# Patient Record
Sex: Male | Born: 1950 | Hispanic: Refuse to answer | Marital: Single | Smoking: Never smoker
Health system: Southern US, Community
[De-identification: ages and names within clinical notes are randomized; demographics above are authoritative.]

## PROBLEM LIST (undated history)

## (undated) DIAGNOSIS — Z85828 Personal history of other malignant neoplasm of skin: Secondary | ICD-10-CM

## (undated) DIAGNOSIS — M0230 Reiter's disease, unspecified site: Secondary | ICD-10-CM

## (undated) DIAGNOSIS — R001 Bradycardia, unspecified: Secondary | ICD-10-CM

## (undated) DIAGNOSIS — E785 Hyperlipidemia, unspecified: Secondary | ICD-10-CM

## (undated) DIAGNOSIS — I491 Atrial premature depolarization: Secondary | ICD-10-CM

## (undated) DIAGNOSIS — M450 Ankylosing spondylitis of multiple sites in spine: Secondary | ICD-10-CM

## (undated) HISTORY — PX: JOINT REPLACEMENT: SHX530

## (undated) HISTORY — PX: OTHER SURGICAL HISTORY: SHX169

---

## 2005-06-04 ENCOUNTER — Ambulatory Visit: Payer: Self-pay | Admitting: Gastroenterology

## 2005-09-13 ENCOUNTER — Ambulatory Visit: Payer: Self-pay | Admitting: Otolaryngology

## 2005-10-25 ENCOUNTER — Ambulatory Visit: Payer: Self-pay | Admitting: Otolaryngology

## 2006-12-13 ENCOUNTER — Emergency Department: Payer: Self-pay | Admitting: Emergency Medicine

## 2009-11-15 ENCOUNTER — Ambulatory Visit: Payer: Self-pay | Admitting: General Practice

## 2009-11-25 ENCOUNTER — Ambulatory Visit: Payer: Self-pay | Admitting: Cardiology

## 2009-11-25 ENCOUNTER — Inpatient Hospital Stay: Payer: Self-pay | Admitting: General Practice

## 2009-11-25 IMAGING — CR DG HIP COMPLETE 2+V*L*
1 series · 4 of 4 positions shown · non-contrast
Comparison: none

REASON FOR EXAM: s/p THA
COMMENTS:   Bedside (portable):Y

PROCEDURE:     DXR - DXR HIP LEFT COMPLETE  - [DATE]  [DATE]
RESULT:     Portable AP views of the left hip show the patient to be status
post left hip revision. No fracture about the prosthetic components is seen.
There is no dislocation at the prosthetic hip joint.

[Series 1: view not recorded · 0.17mm/px · 4 of 4 slices shown]
[im 1/4]
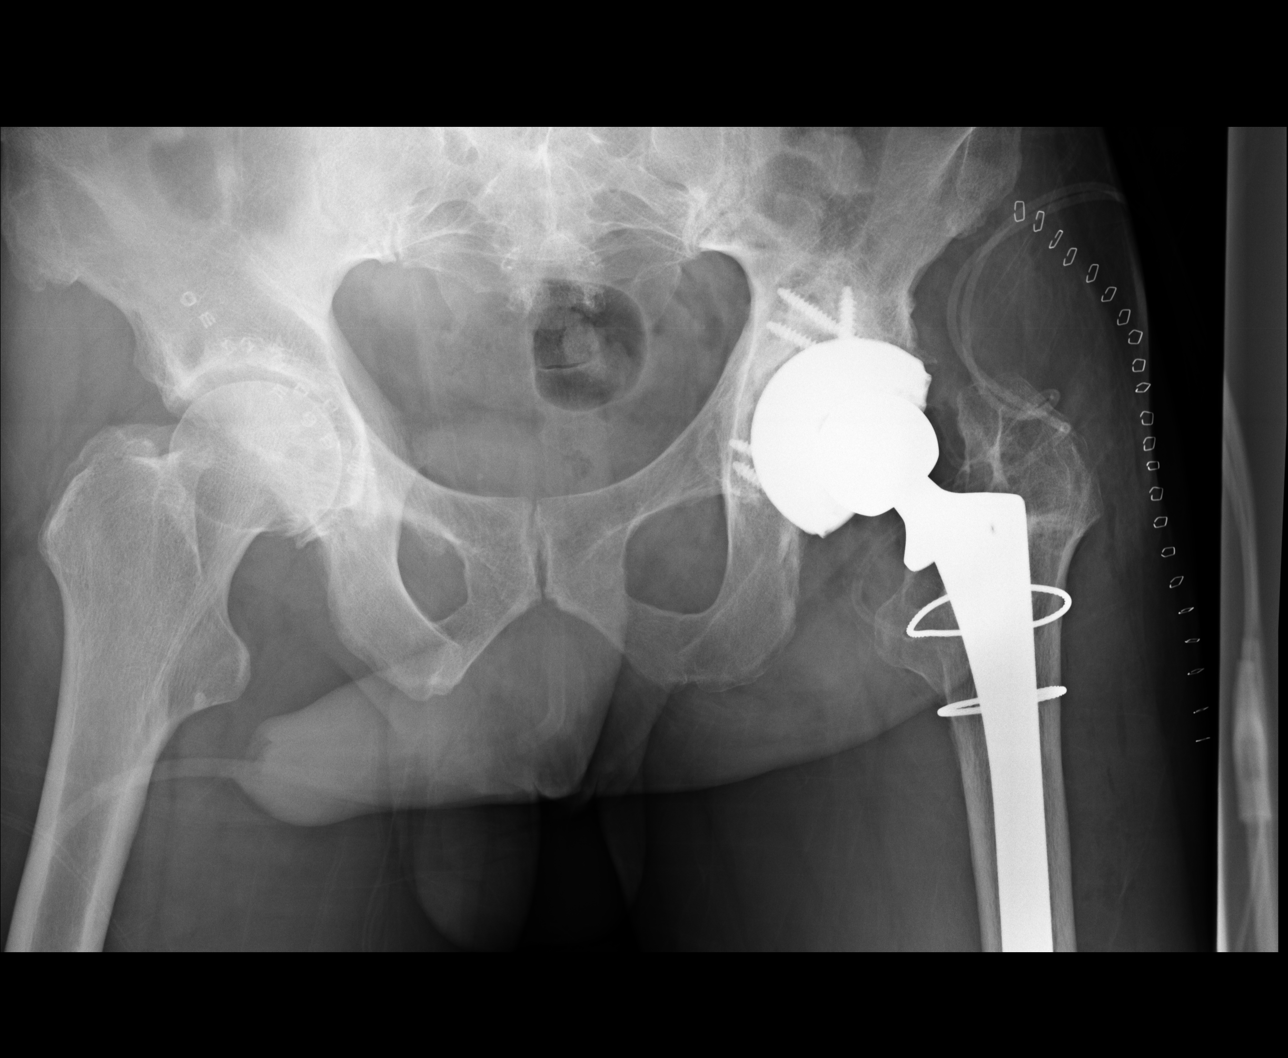
[im 2/4]
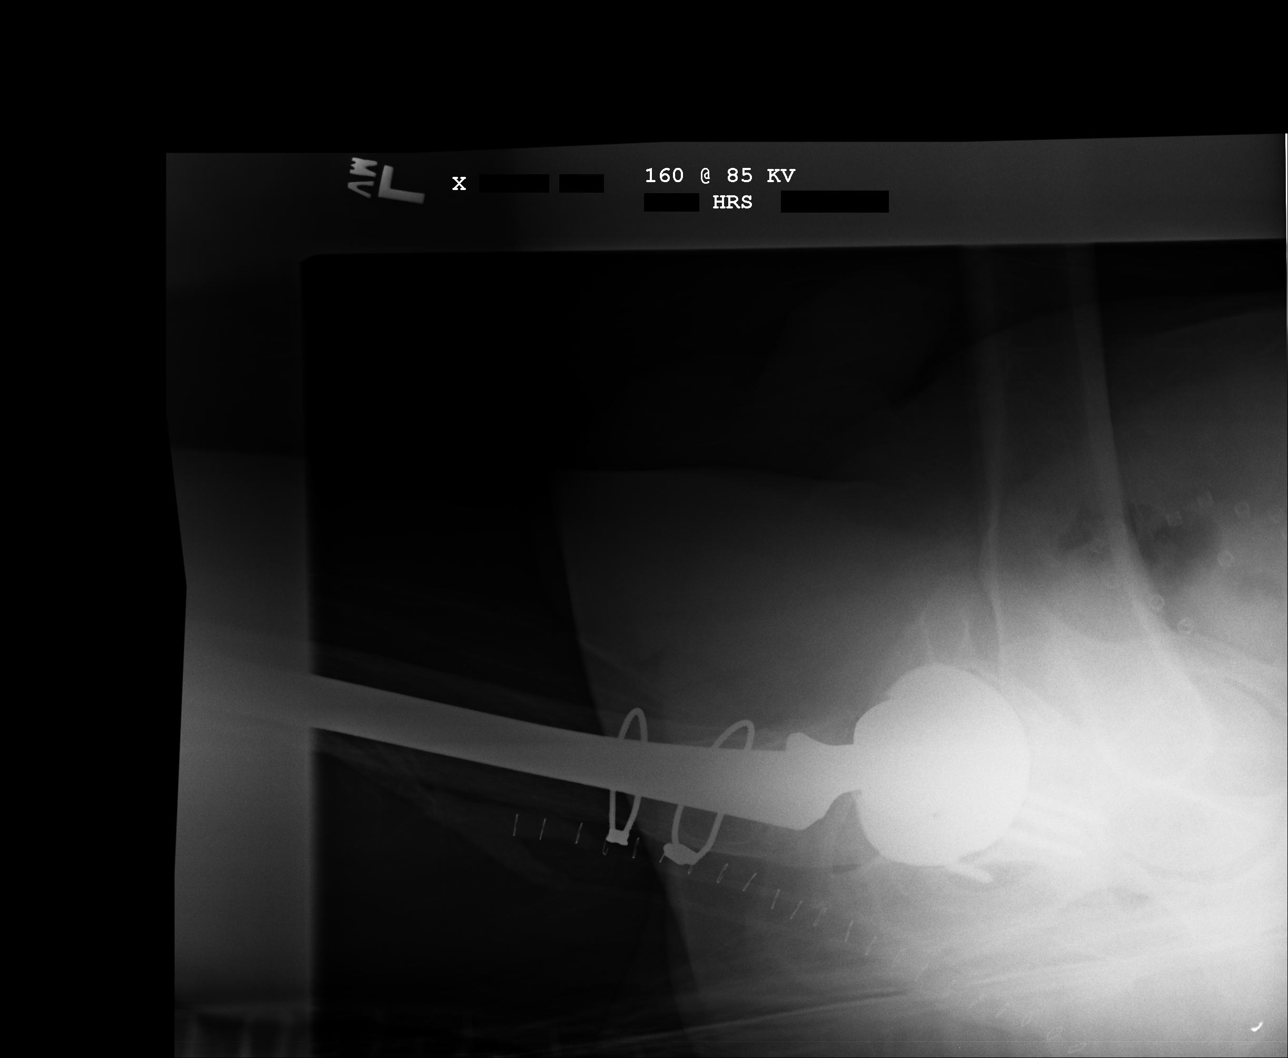
[im 3/4]
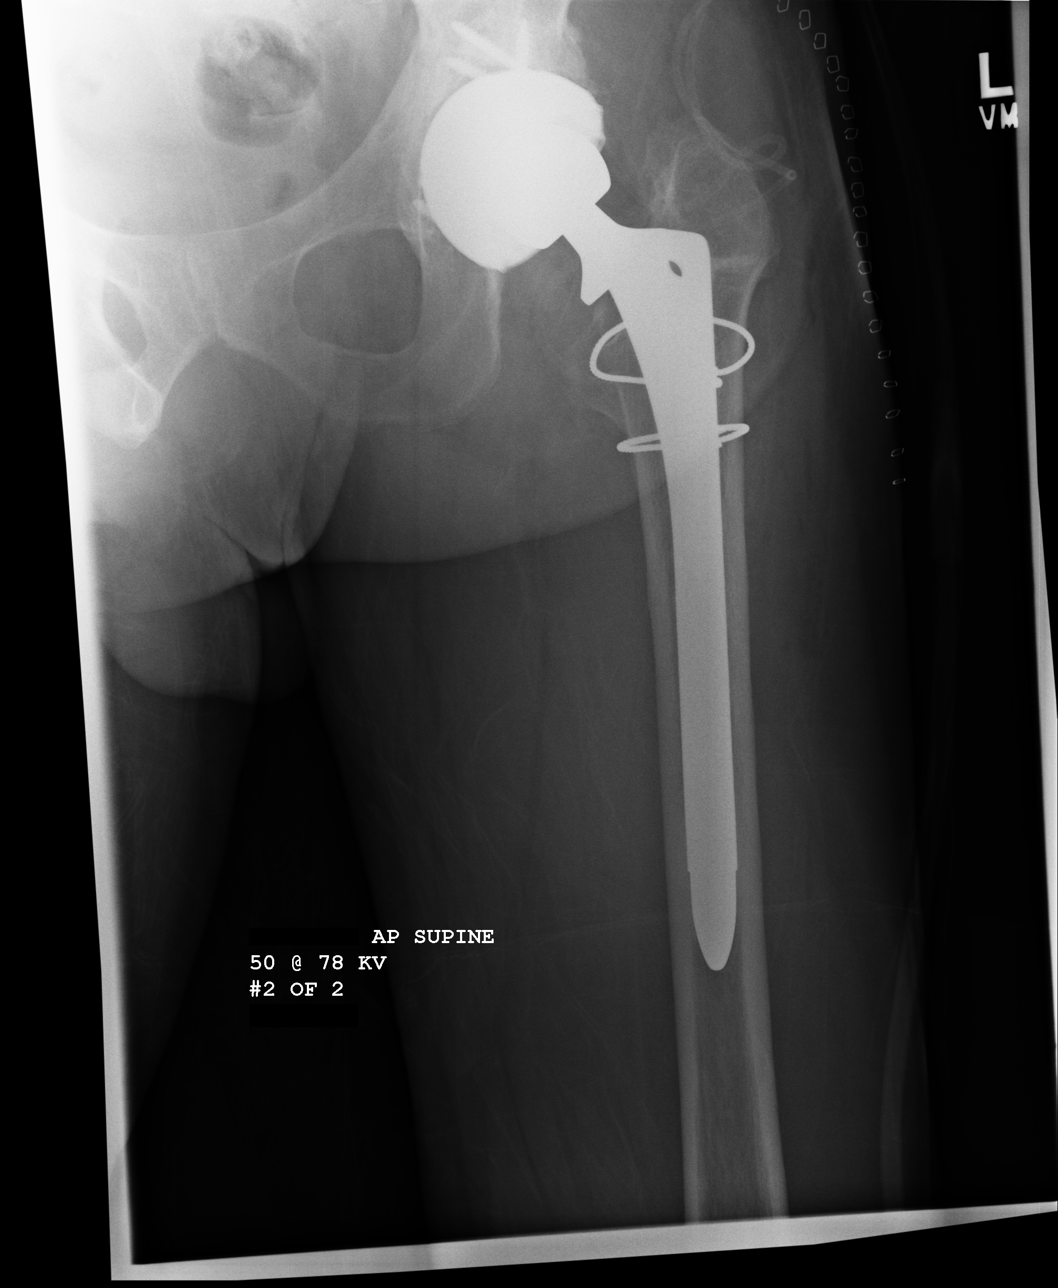
[im 4/4]
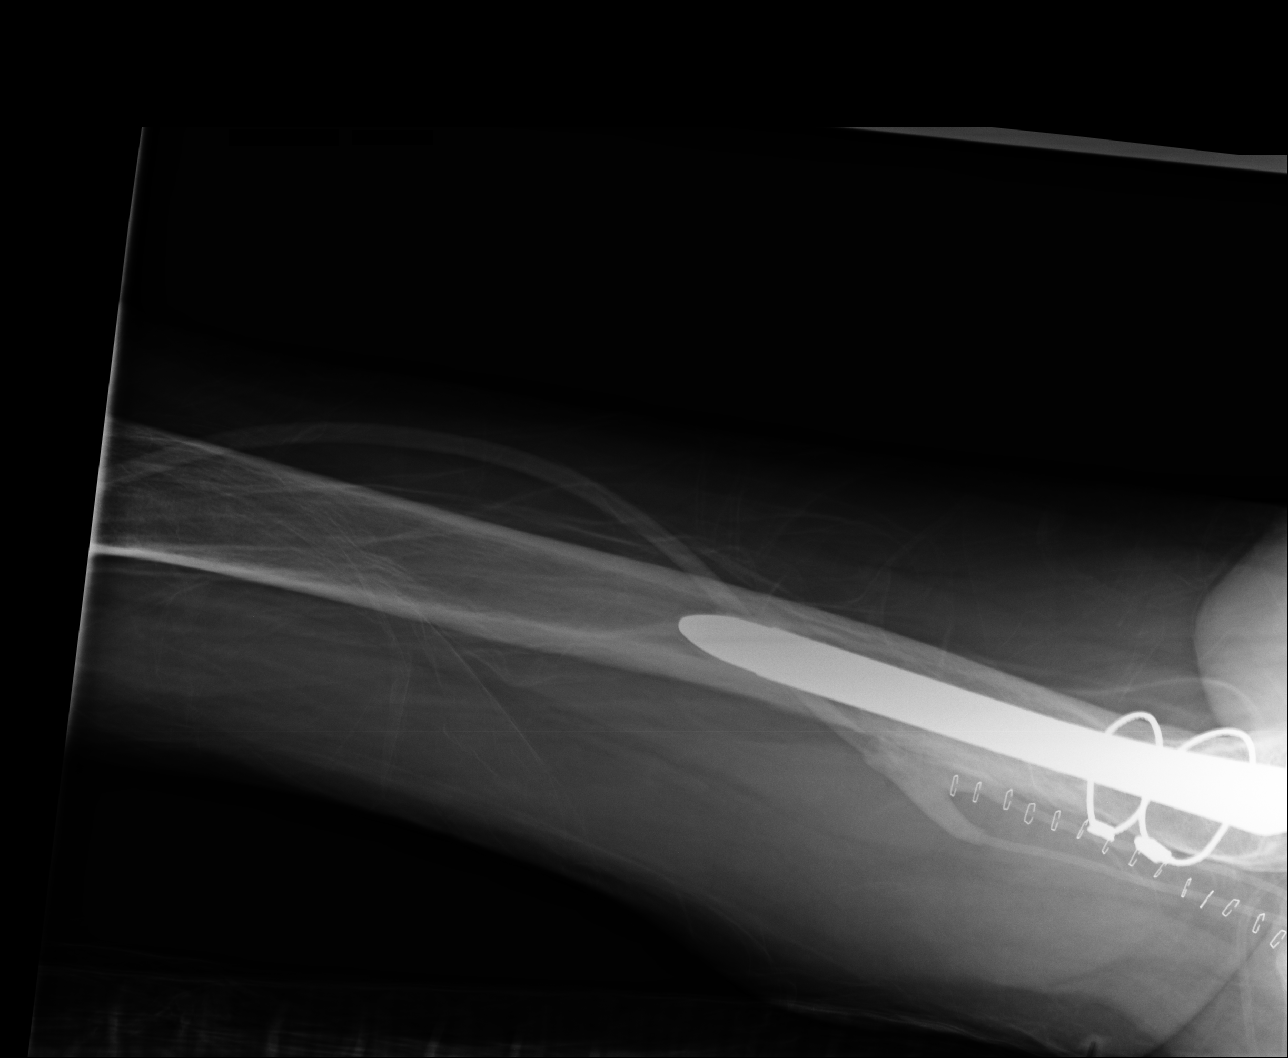

[4 of 4 positions shown; findings below may reference images not displayed]

IMPRESSION: No abnormal post operative changes are identified.

## 2010-10-09 ENCOUNTER — Ambulatory Visit: Payer: Self-pay | Admitting: Gastroenterology

## 2015-12-16 ENCOUNTER — Encounter: Payer: Self-pay | Admitting: *Deleted

## 2015-12-19 ENCOUNTER — Encounter
Admission: RE | Disposition: A | Discharge status: Home / Self Care | Payer: Self-pay | Source: Ambulatory Visit | Attending: Unknown Physician Specialty

## 2015-12-19 ENCOUNTER — Ambulatory Visit
Admission: RE | Admit: 2015-12-19 | Discharge: 2015-12-19 | Disposition: A | Discharge status: Home / Self Care | Payer: Medicare Other | Source: Ambulatory Visit | Attending: Unknown Physician Specialty | Admitting: Unknown Physician Specialty

## 2015-12-19 ENCOUNTER — Ambulatory Visit: Payer: Medicare Other | Admitting: Anesthesiology

## 2015-12-19 ENCOUNTER — Encounter: Payer: Self-pay | Admitting: *Deleted

## 2015-12-19 DIAGNOSIS — K621 Rectal polyp: Secondary | ICD-10-CM | POA: Diagnosis not present

## 2015-12-19 DIAGNOSIS — M459 Ankylosing spondylitis of unspecified sites in spine: Secondary | ICD-10-CM | POA: Insufficient documentation

## 2015-12-19 DIAGNOSIS — Z7982 Long term (current) use of aspirin: Secondary | ICD-10-CM | POA: Diagnosis not present

## 2015-12-19 DIAGNOSIS — Z1211 Encounter for screening for malignant neoplasm of colon: Secondary | ICD-10-CM | POA: Diagnosis present

## 2015-12-19 DIAGNOSIS — D120 Benign neoplasm of cecum: Secondary | ICD-10-CM | POA: Insufficient documentation

## 2015-12-19 DIAGNOSIS — Z79899 Other long term (current) drug therapy: Secondary | ICD-10-CM | POA: Insufficient documentation

## 2015-12-19 DIAGNOSIS — K640 First degree hemorrhoids: Secondary | ICD-10-CM | POA: Diagnosis not present

## 2015-12-19 DIAGNOSIS — K529 Noninfective gastroenteritis and colitis, unspecified: Secondary | ICD-10-CM | POA: Diagnosis not present

## 2015-12-19 DIAGNOSIS — Z8371 Family history of colonic polyps: Secondary | ICD-10-CM | POA: Diagnosis not present

## 2015-12-19 HISTORY — DX: Ankylosing spondylitis of multiple sites in spine: M45.0

## 2015-12-19 HISTORY — PX: COLONOSCOPY WITH PROPOFOL: SHX5780

## 2015-12-19 HISTORY — DX: Reiter's disease, unspecified site: M02.30

## 2015-12-19 SURGERY — COLONOSCOPY WITH PROPOFOL
Anesthesia: General

## 2015-12-19 MED ORDER — SODIUM CHLORIDE 0.9 % IV SOLN: INTRAVENOUS | Status: DC

## 2015-12-19 MED ORDER — PROPOFOL 500 MG/50ML IV EMUL
INTRAVENOUS | Status: DC | PRN
  Administered 2015-12-19: 150 ug/kg/min via INTRAVENOUS

## 2015-12-19 MED ORDER — PHENYLEPHRINE HCL 10 MG/ML IJ SOLN
INTRAMUSCULAR | Status: DC | PRN
  Administered 2015-12-19: 100 ug via INTRAVENOUS

## 2015-12-19 MED ORDER — PIPERACILLIN-TAZOBACTAM 3.375 G IVPB 30 MIN
3.3750 g | Freq: Once | INTRAVENOUS | Status: AC
  Administered 2015-12-19: 3.375 g via INTRAVENOUS
  Filled 2015-12-19: qty 50

## 2015-12-19 MED ORDER — PROPOFOL 10 MG/ML IV BOLUS
INTRAVENOUS | Status: DC | PRN
  Administered 2015-12-19: 30 mg via INTRAVENOUS
  Administered 2015-12-19: 50 mg via INTRAVENOUS

## 2015-12-19 MED ORDER — SODIUM CHLORIDE 0.9 % IV SOLN
INTRAVENOUS | Status: DC
  Administered 2015-12-19: 1000 mL via INTRAVENOUS

## 2015-12-19 NOTE — H&P (Signed)
   Primary Care Physician:  Idelle Crouch, MD Primary Gastroenterologist:  Dr. Vira Agar  Pre-Procedure History & Physical: HPI:  Derek Mcdowell is a 65 y.o. male is here for an colonoscopy.   Past Medical History:  Diagnosis Date  . Ankylosing spondylitis of multiple sites in spine ([place])   . 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 JOINT REPLACEMENT    . left ear surgery      Prior to Admission medications   Medication Sig Start Date End Date Taking? Authorizing Provider  aspirin EC 81 MG tablet Take 81 mg by mouth daily.   Yes Historical Provider, MD  etanercept (ENBREL) 50 MG/ML injection Inject 50 mg into the skin once a week.   Yes Historical Provider, MD  indomethacin (INDOCIN) 25 MG capsule Take 25 mg by mouth 2 (two) times daily with a meal.   Yes Historical Provider, MD  lansoprazole (PREVACID) 30 MG capsule Take 30 mg by mouth daily at 12 noon.   Yes Historical Provider, MD    Allergies as of 10/20/2015  . (Not on File)    History reviewed. No pertinent family history.  Social History   Social History  . Marital status: Single    Spouse name: N/A  . Number of children: N/A  . Years of education: N/A   Occupational History  . Not on file.   Social History Main Topics  . Smoking status: Never Smoker  . Smokeless tobacco: Never Used  . Alcohol use No  . Drug use: No  . Sexual activity: Not on file   Other Topics Concern  . Not on file   Social History Narrative  . No narrative on file    Review of Systems: See HPI, otherwise negative ROS  Physical Exam: BP (!) 148/91   Pulse (!) 54   Temp 97.6 F (36.4 C) (Tympanic)   Resp 20   Ht 6\' 2"  (1.88 m)   Wt 93 kg (205 lb)   SpO2 100%   BMI 26.32 kg/m  General:   Alert,  pleasant and cooperative in NAD Head:  Normocephalic and atraumatic. Neck:  Supple; no masses or thyromegaly. Lungs:  Clear throughout to auscultation.    Heart:  Regular rate and  rhythm. Abdomen:  Soft, nontender and nondistended. Normal bowel sounds, without guarding, and without rebound.   Neurologic:  Alert and  oriented x4;  grossly normal neurologically.  Impression/Plan: Derek Mcdowell is here for an colonoscopy to be performed for FH colon polyps in mother and brother.  Risks, benefits, limitations, and alternatives regarding  colonoscopy have been reviewed with the patient.  Questions have been answered.  All parties agreeable.   Gaylyn Cheers, MD  12/19/2015, 2:52 PM

## 2015-12-19 NOTE — Transfer of Care (Signed)
Immediate Anesthesia Transfer of Care Note  Patient: Derek Mcdowell  Procedure(s) Performed: Procedure(s) with comments: COLONOSCOPY WITH PROPOFOL (N/A) - Zosyn IVPB  Patient Location: Endoscopy Unit  Anesthesia Type:General  Level of Consciousness: sedated  Airway & Oxygen Therapy: Patient connected to nasal cannula oxygen  Post-op Assessment: Post -op Vital signs reviewed and stable  Post vital signs: stable  Last Vitals:  Vitals:   12/19/15 1433 12/19/15 1523  BP: (!) 148/91   Pulse: (!) 54   Resp: 20   Temp: 36.4 C 36.4 C    Last Pain:  Vitals:   12/19/15 1523  TempSrc: Tympanic         Complications: No apparent anesthesia complications

## 2015-12-19 NOTE — Anesthesia Postprocedure Evaluation (Signed)
Anesthesia Post Note  Patient: Derek Mcdowell  Procedure(s) Performed: Procedure(s) (LRB): COLONOSCOPY WITH PROPOFOL (N/A)  Patient location during evaluation: Endoscopy Anesthesia Type: General Level of consciousness: awake and alert Pain management: pain level controlled Vital Signs Assessment: post-procedure vital signs reviewed and stable Respiratory status: spontaneous breathing, nonlabored ventilation, respiratory function stable and patient connected to nasal cannula oxygen Cardiovascular status: blood pressure returned to baseline and stable Postop Assessment: no signs of nausea or vomiting Anesthetic complications: no    Last Vitals:  Vitals:   12/19/15 1543 12/19/15 1553  BP: 121/90 140/85  Pulse: 75 68  Resp: 18 (!) 22  Temp:      Last Pain:  Vitals:   12/19/15 1523  TempSrc: Tympanic  PainSc: Asleep                 Martha Clan

## 2015-12-19 NOTE — Op Note (Signed)
[facility] Gastroenterology Patient Name: Derek Mcdowell Procedure Date: 12/19/2015 2:56 PM MRN: [id number] Account #: [id number] Date of Birth: 05-19-1950 Admit Type: Outpatient Age: 65 Room: [facility] ENDO ROOM 1 Gender: Male Note Status: Finalized Procedure:            Colonoscopy Indications:          Colon cancer screening in patient at increased risk:                        Family history of 1st-degree relative with colon polyps Providers:            Manya Silvas, MD Referring MD:         Leonie Douglas. Doy Hutching, MD (Referring MD) Medicines:            Propofol per Anesthesia Complications:        No immediate complications. Procedure:            Pre-Anesthesia Assessment:                       - After reviewing the risks and benefits, the patient                        was deemed in satisfactory condition to undergo the                        procedure.                       After obtaining informed consent, the colonoscope was                        passed under direct vision. Throughout the procedure,                        the patient's blood pressure, pulse, and oxygen                        saturations were monitored continuously. The                        Colonoscope was introduced through the anus and                        advanced to the the cecum, identified by appendiceal                        orifice and ileocecal valve. The colonoscopy was                        performed without difficulty. The patient tolerated the                        procedure well. The quality of the bowel preparation                        was good. Findings:      Two sessile polyps were found in the rectum and cecum. The polyps were       diminutive in size. These polyps were removed with a jumbo cold forceps.       Resection and retrieval were complete.  Internal hemorrhoids were found during endoscopy. The hemorrhoids were       small and Grade I (internal  hemorrhoids that do not prolapse).      The exam was otherwise without abnormality. Impression:           - Two diminutive polyps in the rectum and in the cecum,                        removed with a jumbo cold forceps. Resected and                        retrieved.                       - Internal hemorrhoids.                       - The examination was otherwise normal. Recommendation:       - Await pathology results. Manya Silvas, MD 12/19/2015 3:20:30 PM This report has been signed electronically. Number of Addenda: 0 Note Initiated On: 12/19/2015 2:56 PM Scope Withdrawal Time: 0 hours 13 minutes 0 seconds  Total Procedure Duration: 0 hours 17 minutes 12 seconds       [facility]

## 2015-12-21 ENCOUNTER — Encounter: Payer: Self-pay | Admitting: Unknown Physician Specialty

## 2015-12-21 LAB — SURGICAL PATHOLOGY

## 2015-12-21 NOTE — Anesthesia Preprocedure Evaluation (Signed)
Anesthesia Evaluation  Patient identified by MRN, date of birth, ID band Patient awake    Reviewed: Allergy & Precautions, H&P , NPO status , Patient's Chart, lab work & pertinent test results, reviewed documented beta blocker date and time   Airway Mallampati: II   Neck ROM: full    Dental  (+) Poor Dentition   Pulmonary neg pulmonary ROS,    Pulmonary exam normal        Cardiovascular negative cardio ROS Normal cardiovascular exam Rhythm:regular Rate:Normal     Neuro/Psych negative neurological ROS  negative psych ROS   GI/Hepatic negative GI ROS, Neg liver ROS,   Endo/Other  negative endocrine ROS  Renal/GU negative Renal ROS  negative genitourinary   Musculoskeletal   Abdominal   Peds  Hematology negative hematology ROS (+)   Anesthesia Other Findings Past Medical History: No date: Ankylosing spondylitis of multiple sites in sp* No date: Reiter's disease ([place]) Past Surgical History: No date: JOINT REPLACEMENT No date: left ear surgery BMI    Body Mass Index:  26.32 kg/m     Reproductive/Obstetrics negative OB ROS                             Anesthesia Physical Anesthesia Plan  ASA: III  Anesthesia Plan: General   Post-op Pain Management:    Induction:   Airway Management Planned:   Additional Equipment:   Intra-op Plan:   Post-operative Plan:   Informed Consent: I have reviewed the patients History and Physical, chart, labs and discussed the procedure including the risks, benefits and alternatives for the proposed anesthesia with the patient or authorized representative who has indicated his/her understanding and acceptance.   Dental Advisory Given  Plan Discussed with: CRNA  Anesthesia Plan Comments:         Anesthesia Quick Evaluation

## 2018-04-09 ENCOUNTER — Observation Stay
Admission: EM | Admit: 2018-04-09 | Discharge: 2018-04-10 | Disposition: A | Discharge status: Home / Self Care | Payer: Medicare Other | Attending: Internal Medicine | Admitting: Internal Medicine

## 2018-04-09 ENCOUNTER — Encounter: Payer: Self-pay | Admitting: *Deleted

## 2018-04-09 ENCOUNTER — Other Ambulatory Visit: Payer: Self-pay

## 2018-04-09 ENCOUNTER — Emergency Department: Payer: Medicare Other

## 2018-04-09 DIAGNOSIS — Z7982 Long term (current) use of aspirin: Secondary | ICD-10-CM | POA: Diagnosis not present

## 2018-04-09 DIAGNOSIS — Z79899 Other long term (current) drug therapy: Secondary | ICD-10-CM | POA: Insufficient documentation

## 2018-04-09 DIAGNOSIS — K219 Gastro-esophageal reflux disease without esophagitis: Secondary | ICD-10-CM | POA: Diagnosis not present

## 2018-04-09 DIAGNOSIS — R002 Palpitations: Secondary | ICD-10-CM | POA: Diagnosis not present

## 2018-04-09 DIAGNOSIS — R0789 Other chest pain: Secondary | ICD-10-CM | POA: Diagnosis not present

## 2018-04-09 DIAGNOSIS — M459 Ankylosing spondylitis of unspecified sites in spine: Secondary | ICD-10-CM | POA: Diagnosis not present

## 2018-04-09 DIAGNOSIS — R079 Chest pain, unspecified: Secondary | ICD-10-CM | POA: Diagnosis present

## 2018-04-09 DIAGNOSIS — E860 Dehydration: Secondary | ICD-10-CM

## 2018-04-09 LAB — BASIC METABOLIC PANEL
Anion gap: 12 (ref 5–15)
BUN: 30 mg/dL — AB (ref 8–23)
CO2: 25 mmol/L (ref 22–32)
Calcium: 9.4 mg/dL (ref 8.9–10.3)
Chloride: 105 mmol/L (ref 98–111)
Creatinine, Ser: 1.48 mg/dL — ABNORMAL HIGH (ref 0.61–1.24)
GFR calc Af Amer: 56 mL/min — ABNORMAL LOW (ref >60)
GFR, EST NON AFRICAN AMERICAN: 48 mL/min — AB (ref >60)
GLUCOSE: 123 mg/dL — AB (ref 70–99)
POTASSIUM: 3.6 mmol/L (ref 3.5–5.1)
Sodium: 142 mmol/L (ref 135–145)

## 2018-04-09 LAB — TROPONIN I
Troponin I: <0.03 ng/mL (ref <0.03)
Troponin I: <0.03 ng/mL (ref <0.03)
Troponin I: <0.03 ng/mL (ref <0.03)

## 2018-04-09 LAB — CBC
HCT: 47.4 % (ref 39.0–52.0)
Hemoglobin: 16.3 g/dL (ref 13.0–17.0)
MCH: 30.1 pg (ref 26.0–34.0)
MCHC: 34.4 g/dL (ref 30.0–36.0)
MCV: 87.6 fL (ref 80.0–100.0)
PLATELETS: 247 10*3/uL (ref 150–400)
RBC: 5.41 MIL/uL (ref 4.22–5.81)
RDW: 12.6 % (ref 11.5–15.5)
WBC: 7.5 10*3/uL (ref 4.0–10.5)
nRBC: 0 % (ref 0.0–0.2)

## 2018-04-09 MED ORDER — ASPIRIN EC 81 MG PO TBEC: 81.0000 mg | DELAYED_RELEASE_TABLET | Freq: Every day | ORAL | Status: DC

## 2018-04-09 MED ORDER — INDOMETHACIN 25 MG PO CAPS
25.0000 mg | ORAL_CAPSULE | Freq: Two times a day (BID) | ORAL | Status: DC
  Filled 2018-04-09 (×2): qty 1

## 2018-04-09 MED ORDER — ASPIRIN 81 MG PO CHEW
324.0000 mg | CHEWABLE_TABLET | Freq: Once | ORAL | Status: AC
  Administered 2018-04-09: 243 mg via ORAL
  Filled 2018-04-09: qty 4

## 2018-04-09 MED ORDER — ACETAMINOPHEN 325 MG PO TABS: 650.0000 mg | ORAL_TABLET | Freq: Four times a day (QID) | ORAL | Status: DC | PRN

## 2018-04-09 MED ORDER — ONDANSETRON HCL 4 MG/2ML IJ SOLN: 4.0000 mg | Freq: Four times a day (QID) | INTRAMUSCULAR | Status: DC | PRN

## 2018-04-09 MED ORDER — ONDANSETRON HCL 4 MG PO TABS: 4.0000 mg | ORAL_TABLET | Freq: Four times a day (QID) | ORAL | Status: DC | PRN

## 2018-04-09 MED ORDER — SODIUM CHLORIDE 0.9% FLUSH
3.0000 mL | Freq: Once | INTRAVENOUS | Status: AC
  Administered 2018-04-09: 3 mL via INTRAVENOUS

## 2018-04-09 MED ORDER — ACETAMINOPHEN 650 MG RE SUPP: 650.0000 mg | Freq: Four times a day (QID) | RECTAL | Status: DC | PRN

## 2018-04-09 MED ORDER — SODIUM CHLORIDE 0.9 % IV BOLUS
1000.0000 mL | Freq: Once | INTRAVENOUS | Status: AC
  Administered 2018-04-09: 1000 mL via INTRAVENOUS

## 2018-04-09 MED ORDER — ENOXAPARIN SODIUM 40 MG/0.4ML ~~LOC~~ SOLN
40.0000 mg | SUBCUTANEOUS | Status: DC
  Administered 2018-04-09: 40 mg via SUBCUTANEOUS
  Filled 2018-04-09: qty 0.4

## 2018-04-09 MED ORDER — PANTOPRAZOLE SODIUM 40 MG PO TBEC: 40.0000 mg | DELAYED_RELEASE_TABLET | Freq: Every day | ORAL | Status: DC

## 2018-04-09 MED ORDER — NITROGLYCERIN 0.4 MG SL SUBL: 0.4000 mg | SUBLINGUAL_TABLET | SUBLINGUAL | Status: DC | PRN

## 2018-04-09 NOTE — ED Notes (Signed)
ED TO INPATIENT HANDOFF REPORT  ED Nurse Name and Phone #: Anda Kraft [phone]  S Name/Age/Gender Derek Mcdowell 68 y.o. male Room/Bed: ED04A/ED04A  Code Status   Code Status: Not on file  Home/SNF/Other Home Patient oriented to: self, place, time and situation Is this baseline? Yes   Triage Complete: Triage complete  Chief Complaint chest pain  Triage Note Pt to triage via wheelchair.  Pt had hoilter monitor placed today.  Now, pt has chest pain, nausea, lightheadness and sob.  Pt alert.      Allergies No Known Allergies  Level of Care/Admitting Diagnosis ED Disposition    ED Disposition Condition [place] [facility]: [facility] [[id number]]  Level of Care: Telemetry [5]  Diagnosis: Chest pain [892119]  Admitting Physician: Henreitta Leber [[id number]]  Attending Physician: Henreitta Leber [[id number]]  PT Class (Do Not Modify): Observation [104]  PT Acc Code (Do Not Modify): Observation [10022]       B Medical/Surgery History Past Medical History:  Diagnosis Date  . Ankylosing spondylitis of multiple sites in spine ([place])   . 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 COLONOSCOPY WITH PROPOFOL N/A 12/19/2015   Procedure: COLONOSCOPY WITH PROPOFOL;  Surgeon: Manya Silvas, MD;  Location: [facility] ENDOSCOPY;  Service: Endoscopy;  Laterality: N/A;  Zosyn IVPB  . JOINT REPLACEMENT    . left ear surgery       A IV Location/Drains/Wounds Patient Lines/Drains/Airways Status   Active Line/Drains/Airways    Name:   Placement date:   Placement time:   Site:   Days:   Peripheral IV 04/09/18 Right Antecubital   04/09/18    1608    Antecubital   less than 1          Intake/Output Last 24 hours No intake or output data in the 24 hours ending 04/09/18 1745  Labs/Imaging Results for orders placed or performed during the hospital encounter of 04/09/18 (from the past 48 hour(s))  Basic metabolic panel      Status: Abnormal   Collection Time: 04/09/18  4:07 PM  Result Value Ref Range   Sodium 142 135 - 145 mmol/L   Potassium 3.6 3.5 - 5.1 mmol/L   Chloride 105 98 - 111 mmol/L   CO2 25 22 - 32 mmol/L   Glucose, Bld 123 (H) 70 - 99 mg/dL   BUN 30 (H) 8 - 23 mg/dL   Creatinine, Ser 1.48 (H) 0.61 - 1.24 mg/dL   Calcium 9.4 8.9 - 10.3 mg/dL   GFR calc non Af Amer 48 (L) >60 mL/min   GFR calc Af Amer 56 (L) >60 mL/min   Anion gap 12 5 - 15    Comment: Performed at [facility], [address] [place], [place] [address]  CBC     Status: None   Collection Time: 04/09/18  4:07 PM  Result Value Ref Range   WBC 7.5 4.0 - 10.5 K/uL   RBC 5.41 4.22 - 5.81 MIL/uL   Hemoglobin 16.3 13.0 - 17.0 g/dL   HCT 47.4 39.0 - 52.0 %   MCV 87.6 80.0 - 100.0 fL   MCH 30.1 26.0 - 34.0 pg   MCHC 34.4 30.0 - 36.0 g/dL   RDW 12.6 11.5 - 15.5 %   Platelets 247 150 - 400 K/uL   nRBC 0.0 0.0 - 0.2 %    Comment: Performed at [facility], [address] [place], [place] [address]  Troponin I - ONCE - STAT     Status: None   Collection Time: 04/09/18  4:07 PM  Result Value Ref Range   Troponin I <0.03 <0.03 ng/mL    Comment: Performed at [facility], [address] [place], [place] [address]   Dg Chest 2 View  Result Date: 04/09/2018 CLINICAL DATA:  68 year old male with chest pain, nausea, shortness of breath and lightheadedness following placement of a Holter monitor. EXAM: CHEST - 2 VIEW COMPARISON:  None. FINDINGS: Electronic monitor projects over the right chest. The lungs are clear and negative for focal airspace consolidation, pulmonary edema or suspicious pulmonary nodule. No pleural effusion or pneumothorax. Cardiac and mediastinal contours are within normal limits. No acute fracture or lytic or blastic osseous lesions. The visualized upper abdominal bowel gas pattern is unremarkable. IMPRESSION: No active cardiopulmonary disease. Electronically Signed   By: Jacqulynn Cadet  M.D.   On: 04/09/2018 16:32    Pending Labs [employer] (From admission, onward)    Start     Ordered   Signed and Held  CBC  (enoxaparin (LOVENOX)    CrCl >/= 30 ml/min)  Once,   R    Comments:  Baseline for enoxaparin therapy IF NOT ALREADY DRAWN.  Notify MD if PLT < 100 K.    Signed and Held   Signed and Held  Creatinine, serum  (enoxaparin (LOVENOX)    CrCl >/= 30 ml/min)  Once,   R    Comments:  Baseline for enoxaparin therapy IF NOT ALREADY DRAWN.    Signed and Held   Signed and Held  Creatinine, serum  (enoxaparin (LOVENOX)    CrCl >/= 30 ml/min)  Weekly,   R    Comments:  while on enoxaparin therapy    Signed and Held   Signed and Held  Troponin I - Now Then Q4H  Now then every 4 hours,   R     Signed and Held          Vitals/Pain Today's Vitals   04/09/18 1559 04/09/18 1700 04/09/18 1715 04/09/18 1730  BP: (!) 95/37 (!) 92/53 112/69 109/75  Pulse: (!) 42 73 (!) 175 (!) 53  Resp: 20 16 (!) 21 19  Temp: 97.7 F (36.5 C)     TempSrc: Oral     SpO2: 99% 100% 94% 99%  Weight:      Height:      PainSc:        Isolation Precautions No active isolations  Medications Medications  nitroGLYCERIN (NITROSTAT) SL tablet 0.4 mg (has no administration in time range)  sodium chloride flush (NS) 0.9 % injection 3 mL (3 mLs Intravenous Given 04/09/18 1623)  aspirin chewable tablet 324 mg (243 mg Oral Given 04/09/18 1625)  sodium chloride 0.9 % bolus 1,000 mL (1,000 mLs Intravenous New Bag/Given 04/09/18 1733)    Mobility walks Low fall risk   Focused Assessments Cardiac Assessment Handoff:  Cardiac Rhythm: Normal sinus rhythm Lab Results  Component Value Date   TROPONINI <0.03 04/09/2018   No results found for: DDIMER Does the Patient currently have chest pain? No     R Recommendations: See Admitting Provider Note  Report given to:   Additional Notes:

## 2018-04-09 NOTE — ED Triage Notes (Signed)
Pt to triage via wheelchair.  Pt had hoilter monitor placed today.  Now, pt has chest pain, nausea, lightheadness and sob.  Pt alert.

## 2018-04-09 NOTE — Plan of Care (Signed)
  Problem: Education: Goal: Knowledge of General Education information will improve Description Including pain rating scale, medication(s)/side effects and non-pharmacologic comfort measures Outcome: Progressing   Problem: Cardiac: Goal: Ability to achieve and maintain adequate cardiovascular perfusion will improve Outcome: Progressing   

## 2018-04-09 NOTE — H&P (Signed)
[place] [facility] [place] NAME: Derek Mcdowell    MR#:  [id number]  DATE OF BIRTH:  12-13-1950  DATE OF ADMISSION:  04/09/2018  PRIMARY CARE PHYSICIAN: Idelle Crouch, MD   REQUESTING/REFERRING PHYSICIAN: Dr. Rudene Re  CHIEF COMPLAINT:   Chief Complaint  Patient presents with  . Chest Pain    HISTORY OF PRESENT ILLNESS:  Derek Mcdowell  is a 68 y.o. male with a known history of ankylosing spondylitis, who presents to the hospital due to chest pain.  Patient says he was in the usual state of health when this afternoon he was watching TV and suddenly became quite diaphoretic and then shortly thereafter developed chest pain/pressure in the center of his chest nonradiating associated with some nausea but no vomiting.  The pain lasted about 30 minutes and resolved on its own.  Patient then just did not feel like himself and thought something was wrong and therefore came to the ER for further evaluation.  Patient has no previous history of cardiac history.  Given his typical symptoms for angina hospital services were contacted for admission.  PAST MEDICAL HISTORY:   Past Medical History:  Diagnosis Date  . Ankylosing spondylitis of multiple sites in spine ([place])   . Reiter's disease ([place])     PAST SURGICAL HISTORY:   Past Surgical History:  Procedure Laterality Date  . COLONOSCOPY WITH PROPOFOL N/A 12/19/2015   Procedure: COLONOSCOPY WITH PROPOFOL;  Surgeon: Manya Silvas, MD;  Location: [facility] ENDOSCOPY;  Service: Endoscopy;  Laterality: N/A;  Zosyn IVPB  . JOINT REPLACEMENT    . left ear surgery      SOCIAL HISTORY:   Social History   Tobacco Use  . Smoking status: Never Smoker  . Smokeless tobacco: Never Used  Substance Use Topics  . Alcohol use: No    FAMILY HISTORY:   Family History  Problem Relation Age of Onset  . Bladder Cancer Mother   . Alzheimer's disease Father   . Pneumonia Father     DRUG ALLERGIES:  No  Known Allergies  REVIEW OF SYSTEMS:   Review of Systems  Constitutional: Negative for fever and weight loss.  HENT: Negative for congestion, nosebleeds and tinnitus.   Eyes: Negative for blurred vision, double vision and redness.  Respiratory: Negative for cough, hemoptysis and shortness of breath.   Cardiovascular: Positive for chest pain. Negative for orthopnea, leg swelling and PND.  Gastrointestinal: Negative for abdominal pain, diarrhea, melena, nausea and vomiting.  Genitourinary: Negative for dysuria, hematuria and urgency.  Musculoskeletal: Negative for falls and joint pain.  Neurological: Negative for dizziness, tingling, sensory change, focal weakness, seizures, weakness and headaches.  Endo/Heme/Allergies: Negative for polydipsia. Does not bruise/bleed easily.  Psychiatric/Behavioral: Negative for depression and memory loss. The patient is not nervous/anxious.     MEDICATIONS AT HOME:   Prior to Admission medications   Medication Sig Start Date End Date Taking? Authorizing Provider  aspirin EC 81 MG tablet Take 81 mg by mouth daily.   Yes [provider]  etanercept (ENBREL) 50 MG/ML injection Inject 50 mg into the skin once a week.   Yes [provider]  indomethacin (INDOCIN) 25 MG capsule Take 25 mg by mouth 2 (two) times daily with a meal.   Yes [provider]  lansoprazole (PREVACID) 30 MG capsule Take 30 mg by mouth daily at 12 noon.   Yes [provider]      VITAL SIGNS:  Blood pressure 112/69, pulse (!) 175, temperature 97.7 F (36.5 C), temperature source Oral, resp. rate (!) 21, height 6\' 2"  (1.88 m), weight 90.7 kg, SpO2 94 %.  PHYSICAL EXAMINATION:  Physical Exam  GENERAL:  68 y.o.-year-old patient lying in the bed in no acute distress.  EYES: Pupils equal, round, reactive to light and accommodation. No scleral icterus. Extraocular muscles intact.  HEENT: Head atraumatic, normocephalic. Oropharynx and nasopharynx  clear. No oropharyngeal erythema, moist oral mucosa  NECK:  Supple, no jugular venous distention. No thyroid enlargement, no tenderness.  LUNGS: Normal breath sounds bilaterally, no wheezing, rales, rhonchi. No use of accessory muscles of respiration.  CARDIOVASCULAR: S1, S2 RRR. No murmurs, rubs, gallops, clicks.  ABDOMEN: Soft, nontender, nondistended. Bowel sounds present. No organomegaly or mass.  EXTREMITIES: No pedal edema, cyanosis, or clubbing. + 2 pedal & radial pulses b/l.   NEUROLOGIC: Cranial nerves II through XII are intact. No focal Motor or sensory deficits appreciated b/l PSYCHIATRIC: The patient is alert and oriented x 3.  SKIN: No obvious rash, lesion, or ulcer.   LABORATORY PANEL:   CBC Recent Labs  Lab 04/09/18 1607  WBC 7.5  HGB 16.3  HCT 47.4  PLT 247   ------------------------------------------------------------------------------------------------------------------  Chemistries  Recent Labs  Lab 04/09/18 1607  NA 142  K 3.6  CL 105  CO2 25  GLUCOSE 123*  BUN 30*  CREATININE 1.48*  CALCIUM 9.4   ------------------------------------------------------------------------------------------------------------------  Cardiac Enzymes Recent Labs  Lab 04/09/18 1607  TROPONINI <0.03   ------------------------------------------------------------------------------------------------------------------  RADIOLOGY:  Dg Chest 2 View  Result Date: 04/09/2018 CLINICAL DATA:  68 year old male with chest pain, nausea, shortness of breath and lightheadedness following placement of a Holter monitor. EXAM: CHEST - 2 VIEW COMPARISON:  None. FINDINGS: Electronic monitor projects over the right chest. The lungs are clear and negative for focal airspace consolidation, pulmonary edema or suspicious pulmonary nodule. No pleural effusion or pneumothorax. Cardiac and mediastinal contours are within normal limits. No acute fracture or lytic or blastic osseous lesions. The  visualized upper abdominal bowel gas pattern is unremarkable. IMPRESSION: No active cardiopulmonary disease. Electronically Signed   By: Jacqulynn Cadet M.D.   On: 04/09/2018 16:32     IMPRESSION AND PLAN:   68 year old male with past medical history of ankylosing spondylitis, Reiter's disease who presents to the hospital due to chest pain.  1.  Chest pain- patient symptoms are quite typical for angina.  He has no previous significant cardiac history or any family history of heart disease. - His EKG shows no acute ST or T wave changes.  His first set of cardiac markers are negative. -I will observe him overnight on telemetry, cycle his cardiac markers. -Continue aspirin, will get a nuclear medicine stress test in the morning.  2.  GERD-continue Protonix.  3.  History of ankylosing spondylitis/Reiter's disease- patient is on Enbrel injections weekly.  Continue indomethacin.    All the records are reviewed and case discussed with ED provider. Management plans discussed with the patient, family and they are in agreement.  CODE STATUS: Full code  TOTAL TIME TAKING CARE OF THIS PATIENT: 40 minutes.    Henreitta Leber M.D on 04/09/2018 at 5:39 PM  Between 7am to 6pm - Pager - [phone]  After 6pm go to [email or web address] - password [email or web address] [facility]  Office  [phone]  CC: Primary care physician; Idelle Crouch, MD

## 2018-04-09 NOTE — ED Provider Notes (Signed)
[facility] Emergency Department Provider Note  ____________________________________________  Time seen: Approximately 4:22 PM  I have reviewed the triage vital signs and the nursing notes.   HISTORY  Chief Complaint Chest Pain   HPI Derek Mcdowell is a 68 y.o. male with h/o Reiter's disease an d ankylosing spondylitis who presents for evaluation of CP.  Patient reports that he was working in his yard putting some pine needles.  He decided to take a break and went inside to watch some of the ballgame.  While sitting on the couch he developed pressure in the center of his chest associated with dizziness, he became clammy, had SOB and nausea. The entire episode lasted 30 min and resolved with no intervention.  At this time he reports feeling very weak and fatigued but no active chest pain.  Patient is very active, goes to the gym 5 days a week.  Has no personal family history of ischemic heart disease, he is not a smoker, does not use drugs or alcohol.  Denies having any prior episodes of chest pain.  Patient saw his primary care doctor a week ago for his regular checkup and was noted to have bradycardia.  He had his first appointment with Dr. Saralyn Pilar this morning where he was found to have regular PACs and bradycardia.  He had a Holter monitor placed.  Past Medical History:  Diagnosis Date  . Ankylosing spondylitis of multiple sites in spine ([place])   . Reiter's disease [facility])     Past Surgical History:  Procedure Laterality Date  . COLONOSCOPY WITH PROPOFOL N/A 12/19/2015   Procedure: COLONOSCOPY WITH PROPOFOL;  Surgeon: Manya Silvas, MD;  Location: [facility] ENDOSCOPY;  Service: Endoscopy;  Laterality: N/A;  Zosyn IVPB  . JOINT REPLACEMENT    . left ear surgery      Prior to Admission medications   Medication Sig Start Date End Date Taking? Authorizing Provider  aspirin EC 81 MG tablet Take 81 mg by mouth daily.    [provider]  etanercept  (ENBREL) 50 MG/ML injection Inject 50 mg into the skin once a week.    [provider]  indomethacin (INDOCIN) 25 MG capsule Take 25 mg by mouth 2 (two) times daily with a meal.    [provider]  lansoprazole (PREVACID) 30 MG capsule Take 30 mg by mouth daily at 12 noon.    [provider]    Allergies Patient has no known allergies.  FH Colon polyps Brother    Arthritis Father    Cancer Mother    Colon polyps Mother      Social History Social History   Tobacco Use  . Smoking status: Never Smoker  . Smokeless tobacco: Never Used  Substance Use Topics  . Alcohol use: No  . Drug use: No    Review of Systems  Constitutional: Negative for fever. Eyes: Negative for visual changes. ENT: Negative for sore throat. Neck: No neck pain  Cardiovascular: + chest pain. Respiratory: + shortness of breath. Gastrointestinal: Negative for abdominal pain, vomiting or diarrhea. Genitourinary: Negative for dysuria. Musculoskeletal: Negative for back pain. Skin: Negative for rash. Neurological: Negative for headaches, weakness or numbness. Psych: No SI or HI  ____________________________________________   PHYSICAL EXAM:  VITAL SIGNS: ED Triage Vitals  Enc Vitals Group     BP 04/09/18 1559 (!) 95/37     Pulse Rate 04/09/18 1559 (!) 42     Resp 04/09/18 1559 20  Temp 04/09/18 1559 97.7 F (36.5 C)     Temp Source 04/09/18 1559 Oral     SpO2 04/09/18 1559 99 %     Weight 04/09/18 1556 200 lb (90.7 kg)     Height 04/09/18 1556 6\' 2"  (1.88 m)     Head Circumference --      Peak Flow --      Pain Score 04/09/18 1556 5     Pain Loc --      Pain Edu? --      Excl. in [place]? --     Constitutional: Alert and oriented. Well appearing and in no apparent distress. HEENT:      Head: Normocephalic and atraumatic.         Eyes: Conjunctivae are normal. Sclera is non-icteric.       Mouth/Throat: Mucous membranes are moist.       Neck: Supple with no  signs of meningismus. Cardiovascular: Regular rate and rhythm. No murmurs, gallops, or rubs. 2+ symmetrical distal pulses are present in all extremities. No JVD. Respiratory: Normal respiratory effort. Lungs are clear to auscultation bilaterally. No wheezes, crackles, or rhonchi.  Gastrointestinal: Soft, non tender, and non distended with positive bowel sounds. No rebound or guarding. Genitourinary: No CVA tenderness. Musculoskeletal: Nontender with normal range of motion in all extremities. No edema, cyanosis, or erythema of extremities. Neurologic: Normal speech and language. Face is symmetric. Moving all extremities. No gross focal neurologic deficits are appreciated. Skin: Skin is warm, dry and intact. No rash noted. Psychiatric: Mood and affect are normal. Speech and behavior are normal.  ____________________________________________   LABS (all labs ordered are listed, but only abnormal results are displayed)  Labs Reviewed  BASIC METABOLIC PANEL - Abnormal; Notable for the following components:      Result Value   Glucose, Bld 123 (*)    BUN 30 (*)    Creatinine, Ser 1.48 (*)    GFR calc non Af Amer 48 (*)    GFR calc Af Amer 56 (*)    All other components within normal limits  CBC  TROPONIN I   ____________________________________________  EKG  ED ECG REPORT I, Rudene Re, the attending physician, personally viewed and interpreted this ECG.  Sinus rhythm with PACs in bigeminy, rate of 78, normal intervals, normal axis, no ST elevations or depressions.  New when compared to prior from 2011 ____________________________________________  RADIOLOGY  I have personally reviewed the images performed during this visit and I agree with the Radiologist's read.   Interpretation by Radiologist:  Dg Chest 2 View  Result Date: 04/09/2018 CLINICAL DATA:  68 year old male with chest pain, nausea, shortness of breath and lightheadedness following placement of a Holter  monitor. EXAM: CHEST - 2 VIEW COMPARISON:  None. FINDINGS: Electronic monitor projects over the right chest. The lungs are clear and negative for focal airspace consolidation, pulmonary edema or suspicious pulmonary nodule. No pleural effusion or pneumothorax. Cardiac and mediastinal contours are within normal limits. No acute fracture or lytic or blastic osseous lesions. The visualized upper abdominal bowel gas pattern is unremarkable. IMPRESSION: No active cardiopulmonary disease. Electronically Signed   By: Jacqulynn Cadet M.D.   On: 04/09/2018 16:32      ____________________________________________   PROCEDURES  Procedure(s) performed: None Procedures Critical Care performed:  None ____________________________________________   INITIAL IMPRESSION / ASSESSMENT AND PLAN / ED COURSE   68 y.o. male with h/o Reiter's disease an d ankylosing spondylitis who presents for evaluation of CP.  Presentation  concerning for ACS.  EKG showing bigeminy PACs but no evidence of acute ischemic changes.  Patient given aspirin.  Currently pain-free.  We will continue to monitor closely on telemetry.  Labs are pending.  Anticipate admission    _________________________ 5:13 PM on 04/09/2018 -----------------------------------------  First troponin is negative.  Patient remains with no further episodes of pain.  Labs showing mild bump on the creatinine of 1.48 most likely from dehydration and working outside.  Will give IV fluids.  Discussed with Dr. Verdell Carmine for admission.   As part of my medical decision making, I reviewed the following data within the [place] notes reviewed and incorporated, Labs reviewed , EKG interpreted , Old EKG reviewed, Old chart reviewed, Radiograph reviewed , Discussed with admitting physician , Notes from prior ED visits and [place] Controlled Substance Database    Pertinent labs & imaging results that were available during my care of the patient were  reviewed by me and considered in my medical decision making (see chart for details).    ____________________________________________   FINAL CLINICAL IMPRESSION(S) / ED DIAGNOSES  Final diagnoses:  Chest pain, unspecified type  Dehydration      NEW MEDICATIONS STARTED DURING THIS VISIT:  ED Discharge Orders    None       Note:  This document was prepared using Dragon voice recognition software and may include unintentional dictation errors.    Alfred Levins, [place], MD 04/09/18 [phone]

## 2018-04-10 ENCOUNTER — Observation Stay: Payer: Medicare Other

## 2018-04-10 LAB — NM MYOCAR MULTI W/SPECT W/WALL MOTION / EF
CHL CUP NUCLEAR SSS: 2
Estimated workload: 1 METS
Exercise duration (min): 1 min
Exercise duration (sec): 17 s
LV dias vol: 83 mL (ref 62–150)
LV sys vol: 24 mL
Peak HR: 102 {beats}/min
Rest HR: 72 {beats}/min
SDS: 0
SRS: 6
TID: 0.95

## 2018-04-10 LAB — TROPONIN I: Troponin I: <0.03 ng/mL (ref <0.03)

## 2018-04-10 MED ORDER — TECHNETIUM TC 99M TETROFOSMIN IV KIT
10.0000 | PACK | Freq: Once | INTRAVENOUS | Status: AC | PRN
  Administered 2018-04-10: 11.11 via INTRAVENOUS

## 2018-04-10 MED ORDER — TECHNETIUM TC 99M TETROFOSMIN IV KIT
29.8700 | PACK | Freq: Once | INTRAVENOUS | Status: AC | PRN
  Administered 2018-04-10: 29.87 via INTRAVENOUS

## 2018-04-10 MED ORDER — REGADENOSON 0.4 MG/5ML IV SOLN
0.4000 mg | Freq: Once | INTRAVENOUS | Status: AC
  Administered 2018-04-10: 0.4 mg via INTRAVENOUS
  Filled 2018-04-10: qty 5

## 2018-04-10 NOTE — Progress Notes (Addendum)
Patient discharged to home. A friend took him. No prescriptions needed.  Follow up appointments made.

## 2018-04-10 NOTE — Consult Note (Signed)
[facility] Cardiology  CARDIOLOGY CONSULT NOTE  Patient ID: Derek Mcdowell MRN: [id number] DOB/AGE: [birth year] 67 y.o.  Admit date: 04/09/2018 Referring Physician Verdell Carmine Primary Physician [facility] Primary Cardiologist Kaneisha Ellenberger Reason for Consultation chest pain  HPI: 68 y.o. male referred for chest pain. Patient was seen yesterday as out patient for bradycardia, holter monitor placed. After patient went home he started to feel "funny", with lightedness and chest discomfort. ECG revealed atrial bigeminy. Troponin negative x 3.  Review of systems complete and found to be negative unless listed above     Past Medical History:  Diagnosis Date  . Ankylosing spondylitis of multiple sites in spine ([place])   . Reiter's disease [facility])     Past Surgical History:  Procedure Laterality Date  . COLONOSCOPY WITH PROPOFOL N/A 12/19/2015   Procedure: COLONOSCOPY WITH PROPOFOL;  Surgeon: Manya Silvas, MD;  Location: [facility] ENDOSCOPY;  Service: Endoscopy;  Laterality: N/A;  Zosyn IVPB  . JOINT REPLACEMENT    . left ear surgery      Medications Prior to Admission  Medication Sig Dispense Refill Last Dose  . aspirin EC 81 MG tablet Take 81 mg by mouth daily.   04/09/2018 at 0430  . etanercept (ENBREL) 50 MG/ML injection Inject 50 mg into the skin once a week.   Past Week at Unknown time  . indomethacin (INDOCIN) 25 MG capsule Take 25 mg by mouth 2 (two) times daily with a meal.   04/09/2018 at 0430  . lansoprazole (PREVACID) 30 MG capsule Take 30 mg by mouth daily at 12 noon.   04/09/2018 at 0430  . Turmeric 500 MG TABS Take 500 mg by mouth daily.   04/09/2018 at [place] History  . Marital status: Single    Spouse name: Not on file  . Number of children: Not on file  . Years of education: Not on file  . Highest education level: Not on file  Occupational History  . Not on file  Social Needs  . Financial resource strain: Not on file  . Food insecurity:    Worry: Not on file     Inability: Not on file  . Transportation needs:    Medical: Not on file    Non-medical: Not on file  Tobacco Use  . Smoking status: Never Smoker  . Smokeless tobacco: Never Used  Substance and Sexual Activity  . Alcohol use: No  . Drug use: No  . Sexual activity: Not on file  Lifestyle  . Physical activity:    Days per week: Not on file    Minutes per session: Not on file  . Stress: Not on file  Relationships  . Social connections:    Talks on phone: Not on file    Gets together: Not on file    Attends religious service: Not on file    Active member of club or organization: Not on file    Attends meetings of clubs or organizations: Not on file    Relationship status: Not on file  . Intimate partner violence:    Fear of current or ex partner: Not on file    Emotionally abused: Not on file    Physically abused: Not on file    Forced sexual activity: Not on file  Other Topics Concern  . Not on file  Social History Narrative  . Not on file    Family History  Problem Relation Age of Onset  . Bladder Cancer Mother   .  Alzheimer's disease Father   . Pneumonia Father       Review of systems complete and found to be negative unless listed above      PHYSICAL EXAM  General: Well developed, well nourished, in no acute distress HEENT:  Normocephalic and atramatic Neck:  No JVD.  Lungs: Clear bilaterally to auscultation and percussion. Heart: HRRR . Normal S1 and S2 without gallops or murmurs.  Abdomen: Bowel sounds are positive, abdomen soft and non-tender  Msk:  Back normal, normal gait. Normal strength and tone for age. Extremities: No clubbing, cyanosis or edema.   Neuro: Alert and oriented X 3. Psych:  Good affect, responds appropriately  Labs:   Lab Results  Component Value Date   WBC 7.5 04/09/2018   HGB 16.3 04/09/2018   HCT 47.4 04/09/2018   MCV 87.6 04/09/2018   PLT 247 04/09/2018    Recent Labs  Lab 04/09/18 1607  NA 142  K 3.6  CL 105   CO2 25  BUN 30*  CREATININE 1.48*  CALCIUM 9.4  GLUCOSE 123*   Lab Results  Component Value Date   TROPONINI <0.03 04/10/2018   No results found for: CHOL No results found for: HDL No results found for: LDLCALC No results found for: TRIG No results found for: CHOLHDL No results found for: LDLDIRECT    Radiology: Dg Chest 2 View  Result Date: 04/09/2018 CLINICAL DATA:  68 year old male with chest pain, nausea, shortness of breath and lightheadedness following placement of a Holter monitor. EXAM: CHEST - 2 VIEW COMPARISON:  None. FINDINGS: Electronic monitor projects over the right chest. The lungs are clear and negative for focal airspace consolidation, pulmonary edema or suspicious pulmonary nodule. No pleural effusion or pneumothorax. Cardiac and mediastinal contours are within normal limits. No acute fracture or lytic or blastic osseous lesions. The visualized upper abdominal bowel gas pattern is unremarkable. IMPRESSION: No active cardiopulmonary disease. Electronically Signed   By: Jacqulynn Cadet M.D.   On: 04/09/2018 16:32    EKG: Atrial bigeminy  ASSESSMENT AND PLAN:    1.  Chest pain, atypical, negative troponin, nondiagnostic ECG  Recommendations  1. Agree with current therapy 2. ETT-Myoview  Signed: Isaias Cowman MD,PhD, [facility] 04/10/2018, 8:09 AM
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
## 2020-03-18 DIAGNOSIS — Z79899 Other long term (current) drug therapy: Secondary | ICD-10-CM | POA: Diagnosis not present

## 2020-03-18 DIAGNOSIS — R7309 Other abnormal glucose: Secondary | ICD-10-CM | POA: Diagnosis not present

## 2020-03-18 DIAGNOSIS — E7849 Other hyperlipidemia: Secondary | ICD-10-CM | POA: Diagnosis not present

## 2020-03-25 DIAGNOSIS — R739 Hyperglycemia, unspecified: Secondary | ICD-10-CM | POA: Diagnosis not present

## 2020-03-25 DIAGNOSIS — E785 Hyperlipidemia, unspecified: Secondary | ICD-10-CM | POA: Diagnosis not present

## 2020-03-25 DIAGNOSIS — Z0000 Encounter for general adult medical examination without abnormal findings: Secondary | ICD-10-CM | POA: Diagnosis not present

## 2020-03-25 DIAGNOSIS — Z1211 Encounter for screening for malignant neoplasm of colon: Secondary | ICD-10-CM | POA: Diagnosis not present

## 2020-12-19 ENCOUNTER — Encounter: Payer: Self-pay | Admitting: *Deleted

## 2020-12-20 ENCOUNTER — Ambulatory Visit: Payer: Medicare Other | Admitting: Anesthesiology

## 2020-12-20 ENCOUNTER — Ambulatory Visit
Admission: RE | Admit: 2020-12-20 | Discharge: 2020-12-20 | Disposition: A | Discharge status: Home / Self Care | Payer: Medicare Other | Attending: Gastroenterology | Admitting: Gastroenterology

## 2020-12-20 ENCOUNTER — Encounter
Admission: RE | Disposition: A | Discharge status: Home / Self Care | Payer: Self-pay | Source: Home / Self Care | Attending: Gastroenterology

## 2020-12-20 DIAGNOSIS — Z8371 Family history of colonic polyps: Secondary | ICD-10-CM | POA: Insufficient documentation

## 2020-12-20 DIAGNOSIS — M1990 Unspecified osteoarthritis, unspecified site: Secondary | ICD-10-CM | POA: Diagnosis not present

## 2020-12-20 DIAGNOSIS — K635 Polyp of colon: Secondary | ICD-10-CM | POA: Insufficient documentation

## 2020-12-20 DIAGNOSIS — Z1211 Encounter for screening for malignant neoplasm of colon: Secondary | ICD-10-CM | POA: Diagnosis not present

## 2020-12-20 DIAGNOSIS — Z85828 Personal history of other malignant neoplasm of skin: Secondary | ICD-10-CM | POA: Insufficient documentation

## 2020-12-20 DIAGNOSIS — K219 Gastro-esophageal reflux disease without esophagitis: Secondary | ICD-10-CM | POA: Insufficient documentation

## 2020-12-20 DIAGNOSIS — K640 First degree hemorrhoids: Secondary | ICD-10-CM | POA: Diagnosis not present

## 2020-12-20 HISTORY — DX: Personal history of other malignant neoplasm of skin: Z85.828

## 2020-12-20 HISTORY — DX: Bradycardia, unspecified: R00.1

## 2020-12-20 HISTORY — DX: Hyperlipidemia, unspecified: E78.5

## 2020-12-20 HISTORY — DX: Atrial premature depolarization: I49.1

## 2020-12-20 HISTORY — PX: COLONOSCOPY WITH PROPOFOL: SHX5780

## 2020-12-20 SURGERY — COLONOSCOPY WITH PROPOFOL
Anesthesia: General

## 2020-12-20 MED ORDER — STERILE WATER FOR IRRIGATION IR SOLN
Status: DC | PRN
  Administered 2020-12-20: 60 mL

## 2020-12-20 MED ORDER — PROPOFOL 10 MG/ML IV BOLUS
INTRAVENOUS | Status: DC | PRN
  Administered 2020-12-20: 60 mg via INTRAVENOUS

## 2020-12-20 MED ORDER — LIDOCAINE HCL (CARDIAC) PF 100 MG/5ML IV SOSY
PREFILLED_SYRINGE | INTRAVENOUS | Status: DC | PRN
  Administered 2020-12-20: 50 mg via INTRAVENOUS

## 2020-12-20 MED ORDER — SODIUM CHLORIDE 0.9 % IV SOLN: INTRAVENOUS | Status: DC

## 2020-12-20 MED ORDER — PROPOFOL 500 MG/50ML IV EMUL
INTRAVENOUS | Status: AC
  Filled 2020-12-20: qty 50

## 2020-12-20 MED ORDER — PROPOFOL 500 MG/50ML IV EMUL
INTRAVENOUS | Status: DC | PRN
  Administered 2020-12-20: 175 ug/kg/min via INTRAVENOUS

## 2020-12-20 NOTE — Transfer of Care (Signed)
Immediate Anesthesia Transfer of Care Note  Patient: Derek Mcdowell  Procedure(s) Performed: COLONOSCOPY WITH PROPOFOL  Patient Location: PACU  Anesthesia Type:General  Level of Consciousness: awake and drowsy  Airway & Oxygen Therapy: Patient Spontanous Breathing  Post-op Assessment: Report given to RN and Post -op Vital signs reviewed and stable  Post vital signs: Reviewed and stable  Last Vitals:  Vitals Value Taken Time  BP 98/68 12/20/20 0849  Temp    Pulse 71 12/20/20 0849  Resp 24 12/20/20 0849  SpO2 99 % 12/20/20 0849    Last Pain:  Vitals:   12/20/20 0708  PainSc: 0-No pain         Complications: No notable events documented.

## 2020-12-20 NOTE — Anesthesia Postprocedure Evaluation (Signed)
Anesthesia Post Note  Patient: Derek Mcdowell  Procedure(s) Performed: COLONOSCOPY WITH PROPOFOL  Patient location during evaluation: Phase II Anesthesia Type: General Level of consciousness: awake and alert, awake and oriented Pain management: pain level controlled Vital Signs Assessment: post-procedure vital signs reviewed and stable Respiratory status: spontaneous breathing, nonlabored ventilation and respiratory function stable Cardiovascular status: blood pressure returned to baseline and stable Postop Assessment: no apparent nausea or vomiting Anesthetic complications: no   No notable events documented.   Last Vitals:  Vitals:   12/20/20 0910 12/20/20 0920  BP: 130/65 125/65  Pulse: (!) 52 (!) 52  Resp: 19 20  Temp:    SpO2: 100% 100%    Last Pain:  Vitals:   12/20/20 0848  PainSc: 0-No pain                 Phill Mutter

## 2020-12-20 NOTE — Anesthesia Procedure Notes (Signed)
Date/Time: 12/20/2020 8:26 AM Performed by: Johnna Acosta, CRNA Pre-anesthesia Checklist: Patient identified, Emergency Drugs available, Suction available, Patient being monitored and Timeout performed Patient Re-evaluated:Patient Re-evaluated prior to induction Oxygen Delivery Method: Nasal cannula Preoxygenation: Pre-oxygenation with 100% oxygen Induction Type: IV induction

## 2020-12-20 NOTE — Op Note (Signed)
[facility] Gastroenterology Patient Name: Derek Mcdowell Procedure Date: 12/20/2020 8:11 AM MRN: [id number] Account #: [id number] Date of Birth: 1950-02-06 Admit Type: Outpatient Age: 70 Room: [facility] ENDO ROOM 1 Gender: Male Note Status: Finalized Instrument Name: Jasper Riling [id number] Procedure:             Colonoscopy Indications:           Colon cancer screening in patient at increased risk:                         Family history of 1st-degree relative with colon polyps Providers:             Andrey Farmer MD, MD Referring MD:          Leonie Douglas. Doy Hutching, MD (Referring MD) Medicines:             Monitored Anesthesia Care Complications:         No immediate complications. Estimated blood loss:                         Minimal. Procedure:             Pre-Anesthesia Assessment:                        - Prior to the procedure, a History and Physical was                         performed, and patient medications and allergies were                         reviewed. The patient is competent. The risks and                         benefits of the procedure and the sedation options and                         risks were discussed with the patient. All questions                         were answered and informed consent was obtained.                         Patient identification and proposed procedure were                         verified by the physician, the nurse, the anesthetist                         and the technician in the endoscopy suite. Mental                         Status Examination: alert and oriented. Airway                         Examination: normal oropharyngeal airway and neck                         mobility. Respiratory Examination: clear to  auscultation. CV Examination: normal. Prophylactic                         Antibiotics: The patient does not require prophylactic                         antibiotics. Prior Anticoagulants:  The patient has                         taken no previous anticoagulant or antiplatelet                         agents. ASA Grade Assessment: II - A patient with mild                         systemic disease. After reviewing the risks and                         benefits, the patient was deemed in satisfactory                         condition to undergo the procedure. The anesthesia                         plan was to use monitored anesthesia care (MAC).                         Immediately prior to administration of medications,                         the patient was re-assessed for adequacy to receive                         sedatives. The heart rate, respiratory rate, oxygen                         saturations, blood pressure, adequacy of pulmonary                         ventilation, and response to care were monitored                         throughout the procedure. The physical status of the                         patient was re-assessed after the procedure.                        After obtaining informed consent, the colonoscope was                         passed under direct vision. Throughout the procedure,                         the patient's blood pressure, pulse, and oxygen                         saturations were monitored continuously. The  Colonoscope was introduced through the anus and                         advanced to the the cecum, identified by appendiceal                         orifice and ileocecal valve. The colonoscopy was                         performed without difficulty. The patient tolerated                         the procedure well. The quality of the bowel                         preparation was good. Findings:      The perianal and digital rectal examinations were normal.      A 1 mm polyp was found in the ileocecal valve. The polyp was sessile.       The polyp was removed with a jumbo cold forceps. Resection and retrieval        were complete. Estimated blood loss was minimal.      Internal hemorrhoids were found during retroflexion. The hemorrhoids       were Grade I (internal hemorrhoids that do not prolapse).      The exam was otherwise without abnormality on direct and retroflexion       views. Impression:            - One 1 mm polyp at the ileocecal valve, removed with                         a jumbo cold forceps. Resected and retrieved.                        - Internal hemorrhoids.                        - The examination was otherwise normal on direct and                         retroflexion views. Recommendation:        - Discharge patient to home.                        - Resume previous diet.                        - Continue present medications.                        - Await pathology results.                        - Repeat colonoscopy for surveillance based on                         pathology results.                        - Return to referring physician as previously  scheduled. Procedure Code(s):     --- Professional ---                        [phone], Colonoscopy, flexible; with biopsy, single or                         multiple Diagnosis Code(s):     --- Professional ---                        Z83.71, Family history of colonic polyps                        K63.5, Polyp of colon                        K64.0, First degree hemorrhoids CPT copyright 2019 [employer]. All rights reserved. The codes documented in this report are preliminary and upon coder review may  be revised to meet current compliance requirements. Andrey Farmer MD, MD 12/20/2020 8:44:14 AM Number of Addenda: 0 Note Initiated On: 12/20/2020 8:11 AM Scope Withdrawal Time: 0 hours 8 minutes 42 seconds  Total Procedure Duration: 0 hours 11 minutes 43 seconds  Estimated Blood Loss:  Estimated blood loss was minimal.      [facility]

## 2020-12-20 NOTE — Anesthesia Preprocedure Evaluation (Signed)
Anesthesia Evaluation  Patient identified by MRN, date of birth, ID band Patient awake    Reviewed: Allergy & Precautions, H&P , NPO status , Patient's Chart, lab work & pertinent test results  Airway Mallampati: III  TM Distance: >3 FB Neck ROM: Full    Dental no notable dental hx.    Pulmonary neg pulmonary ROS,    Pulmonary exam normal        Cardiovascular Normal cardiovascular exam+ dysrhythmias (SB, PAC's)      Neuro/Psych negative neurological ROS  negative psych ROS   GI/Hepatic Neg liver ROS, GERD  Medicated,  Endo/Other  negative endocrine ROS  Renal/GU negative Renal ROS  negative genitourinary   Musculoskeletal  (+) Arthritis ,   Abdominal   Peds negative pediatric ROS (+)  Hematology negative hematology ROS (+)   Anesthesia Other Findings . Ankylosing spondylitis of multiple sites in spine (CMS-HCC)  . Bradycardia, sinus  . Premature atrial contractions  . Chest pain  . Hyperlipidemia, familial, high LDL  . Abnormal glucose  . History of nonmelanoma skin cancer    Reproductive/Obstetrics negative OB ROS                            Anesthesia Physical Anesthesia Plan  ASA: 2  Anesthesia Plan: General   Post-op Pain Management:    Induction: Intravenous  PONV Risk Score and Plan: 2 and Propofol infusion and TIVA  Airway Management Planned: Natural Airway and Nasal Cannula  Additional Equipment:   Intra-op Plan:   Post-operative Plan:   Informed Consent: I have reviewed the patients History and Physical, chart, labs and discussed the procedure including the risks, benefits and alternatives for the proposed anesthesia with the patient or authorized representative who has indicated his/her understanding and acceptance.       Plan Discussed with: CRNA, Anesthesiologist and Surgeon  Anesthesia Plan Comments:         Anesthesia Quick Evaluation

## 2020-12-20 NOTE — H&P (Signed)
Outpatient short stay form Pre-procedure 12/20/2020  Derek Rubenstein, MD  Primary Physician: Idelle Crouch, MD  Reason for visit:  Screening  History of present illness:   70 y/o gentleman with history of arthritis and family history of polyps here for screening colonoscopy. No blood thinners. No family history of GI malignancies. No abdominal surgeries.    Current Facility-Administered Medications:    0.9 %  sodium chloride infusion, , Intravenous, Continuous, Ariadna Setter, Hilton Cork, MD, Last Rate: 20 mL/hr at 12/20/20 2800, New Bag at 12/20/20 0722  Medications Prior to Admission  Medication Sig Dispense Refill Last Dose   Niacinamide-Zn-Cu-Methfo-Se-Cr (NICOTINAMIDE) 750-27-2-0.5 MG TABS Take by mouth.      aspirin EC 81 MG tablet Take 81 mg by mouth daily.      etanercept (ENBREL) 50 MG/ML injection Inject 50 mg into the skin once a week. (Patient not taking: Reported on 12/20/2020)   Not Taking   indomethacin (INDOCIN) 25 MG capsule Take 25 mg by mouth 2 (two) times daily with a meal.      lansoprazole (PREVACID) 30 MG capsule Take 30 mg by mouth daily at 12 noon.      Turmeric 500 MG TABS Take 500 mg by mouth daily.        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    Physical Exam  Gen: Alert, oriented. Appears stated age.  HEENT: PERRLA. Lungs: No respiratory distress CV: RRR Abd: soft, benign, no masses Ext: No edema    Planned procedures: Proceed with colonoscopy. The patient understands the nature of the planned procedure, indications, risks, alternatives and potential complications including but not limited to bleeding, infection, perforation, damage to internal organs and possible  oversedation/side effects from anesthesia. The patient agrees and gives consent to proceed.  Please refer to procedure notes for findings, recommendations and patient disposition/instructions.     Derek Rubenstein, MD [facility] Gastroenterology

## 2020-12-20 NOTE — Interval H&P Note (Signed)
History and Physical Interval Note:  12/20/2020 8:12 AM  Derek Mcdowell  has presented today for surgery, with the diagnosis of family history colon polyps.  The various methods of treatment have been discussed with the patient and family. After consideration of risks, benefits and other options for treatment, the patient has consented to  Procedure(s): COLONOSCOPY WITH PROPOFOL (N/A) as a surgical intervention.  The patient's history has been reviewed, patient examined, no change in status, stable for surgery.  I have reviewed the patient's chart and labs.  Questions were answered to the patient's satisfaction.     Derek Mcdowell  Ok to proceed with colonoscopy

## 2020-12-20 NOTE — Anesthesia Postprocedure Evaluation (Signed)
Anesthesia Post Note  Patient: Derek Mcdowell  Procedure(s) Performed: COLONOSCOPY WITH PROPOFOL  Anesthesia Type: General Anesthetic complications: no   No notable events documented.   Last Vitals:  Vitals:   12/20/20 0910 12/20/20 0920  BP: 130/65 125/65  Pulse: (!) 52 (!) 52  Resp: 19 20  Temp:    SpO2: 100% 100%    Last Pain:  Vitals:   12/20/20 0848  PainSc: 0-No pain                 Phill Mutter

## 2020-12-21 ENCOUNTER — Encounter: Payer: Self-pay | Admitting: Gastroenterology

## 2020-12-21 LAB — SURGICAL PATHOLOGY

## 2021-01-10 ENCOUNTER — Other Ambulatory Visit: Payer: Self-pay

## 2021-01-10 ENCOUNTER — Encounter: Payer: Self-pay | Admitting: Podiatry

## 2021-01-10 ENCOUNTER — Ambulatory Visit: Payer: Medicare Other | Admitting: Podiatry

## 2021-01-10 DIAGNOSIS — B351 Tinea unguium: Secondary | ICD-10-CM

## 2021-01-10 DIAGNOSIS — M79674 Pain in right toe(s): Secondary | ICD-10-CM | POA: Diagnosis not present

## 2021-01-10 DIAGNOSIS — M79675 Pain in left toe(s): Secondary | ICD-10-CM | POA: Diagnosis not present

## 2021-01-10 NOTE — Progress Notes (Signed)
  Subjective:  Patient ID: Derek Mcdowell, male    DOB: 09-16-1950,  MRN: [id number]  Chief Complaint  Patient presents with   Callouses    Nail and callus trim    70 y.o. male returns for the above complaint.  Patient presents with thickened elongated dystrophic toenails x10.  Mild pain on palpation.  Patient would like to have them debrided down.  He has not seen anyone else prior to seeing me.  He denies any other acute complaints  Objective:  There were no vitals filed for this visit. Podiatric Exam: Vascular: dorsalis pedis and posterior tibial pulses are palpable bilateral. Capillary return is immediate. Temperature gradient is WNL. Skin turgor WNL  Sensorium: Normal Semmes Weinstein monofilament test. Normal tactile sensation bilaterally. Nail Exam: Pt has thick disfigured discolored nails with subungual debris noted bilateral entire nail hallux through fifth toenails.  Pain on palpation to the nails. Ulcer Exam: There is no evidence of ulcer or pre-ulcerative changes or infection. Orthopedic Exam: Muscle tone and strength are WNL. No limitations in general ROM. No crepitus or effusions noted.  Skin: No Porokeratosis. No infection or ulcers    Assessment & Plan:   1. Pain due to onychomycosis of toenails of both feet     Patient was evaluated and treated and all questions answered.  Onychomycosis with pain  -Nails palliatively debrided as below. -Educated on self-care  Procedure: Nail Debridement Rationale: pain  Type of Debridement: manual, sharp debridement. Instrumentation: Nail nipper, rotary burr. Number of Nails: 10  Procedures and Treatment: Consent by patient was obtained for treatment procedures. The patient understood the discussion of treatment and procedures well. All questions were answered thoroughly reviewed. Debridement of mycotic and hypertrophic toenails, 1 through 5 bilateral and clearing of subungual debris. No ulceration, no infection noted.  Return  Visit-Office Procedure: Patient instructed to return to the office for a follow up visit 3 months for continued evaluation and treatment.  Boneta Lucks, DPM    Return in about 3 months (around 04/10/2021).

## 2021-02-01 ENCOUNTER — Other Ambulatory Visit: Payer: Self-pay | Admitting: Orthopedic Surgery

## 2021-02-01 DIAGNOSIS — Z96642 Presence of left artificial hip joint: Secondary | ICD-10-CM

## 2021-02-10 ENCOUNTER — Encounter
Admission: RE | Admit: 2021-02-10 | Discharge: 2021-02-10 | Disposition: A | Discharge status: Home / Self Care | Payer: Medicare Other | Source: Ambulatory Visit | Attending: Orthopedic Surgery | Admitting: Orthopedic Surgery

## 2021-02-10 ENCOUNTER — Other Ambulatory Visit: Payer: Self-pay

## 2021-02-10 DIAGNOSIS — Z96642 Presence of left artificial hip joint: Secondary | ICD-10-CM | POA: Insufficient documentation

## 2021-02-10 MED ORDER — TECHNETIUM TC 99M MEDRONATE IV KIT
20.0000 | PACK | Freq: Once | INTRAVENOUS | Status: AC | PRN
  Administered 2021-02-10: 21.05 via INTRAVENOUS

## 2021-04-11 ENCOUNTER — Ambulatory Visit: Payer: Medicare Other | Admitting: Podiatry
